# Patient Record
Sex: Female | Born: 1970 | Race: White | Hispanic: No | Marital: Married | Smoking: Former smoker
Health system: Southern US, Community
[De-identification: ages and names within clinical notes are randomized; demographics above are authoritative.]

## PROBLEM LIST (undated history)

## (undated) ENCOUNTER — Ambulatory Visit: Admission: EM | Source: Home / Self Care

## (undated) DIAGNOSIS — E2839 Other primary ovarian failure: Secondary | ICD-10-CM

## (undated) DIAGNOSIS — E288 Other ovarian dysfunction: Secondary | ICD-10-CM

## (undated) DIAGNOSIS — R87619 Unspecified abnormal cytological findings in specimens from cervix uteri: Secondary | ICD-10-CM

## (undated) DIAGNOSIS — N870 Mild cervical dysplasia: Secondary | ICD-10-CM

## (undated) DIAGNOSIS — I10 Essential (primary) hypertension: Secondary | ICD-10-CM

## (undated) DIAGNOSIS — IMO0002 Reserved for concepts with insufficient information to code with codable children: Secondary | ICD-10-CM

## (undated) DIAGNOSIS — A630 Anogenital (venereal) warts: Secondary | ICD-10-CM

## (undated) HISTORY — PX: FOOT SURGERY: SHX648

## (undated) HISTORY — DX: Other primary ovarian failure: E28.39

## (undated) HISTORY — DX: Anogenital (venereal) warts: A63.0

## (undated) HISTORY — DX: Mild cervical dysplasia: N87.0

## (undated) HISTORY — DX: Unspecified abnormal cytological findings in specimens from cervix uteri: R87.619

## (undated) HISTORY — DX: Other ovarian dysfunction: E28.8

## (undated) HISTORY — DX: Reserved for concepts with insufficient information to code with codable children: IMO0002

## (undated) HISTORY — PX: OVARIAN CYST REMOVAL: SHX89

---

## 2000-10-10 ENCOUNTER — Emergency Department (HOSPITAL_COMMUNITY): Admission: EM | Admit: 2000-10-10 | Discharge: 2000-10-10 | Payer: Self-pay | Admitting: *Deleted

## 2004-05-21 ENCOUNTER — Emergency Department (HOSPITAL_COMMUNITY): Admission: EM | Admit: 2004-05-21 | Discharge: 2004-05-21 | Payer: Self-pay | Admitting: Family Medicine

## 2004-07-12 ENCOUNTER — Other Ambulatory Visit: Admission: RE | Admit: 2004-07-12 | Discharge: 2004-07-12 | Payer: Self-pay | Admitting: Obstetrics and Gynecology

## 2005-04-11 ENCOUNTER — Other Ambulatory Visit: Admission: RE | Admit: 2005-04-11 | Discharge: 2005-04-11 | Payer: Self-pay | Admitting: Obstetrics and Gynecology

## 2006-01-05 ENCOUNTER — Emergency Department (HOSPITAL_COMMUNITY): Admission: EM | Admit: 2006-01-05 | Discharge: 2006-01-05 | Payer: Self-pay | Admitting: Family Medicine

## 2006-01-26 ENCOUNTER — Emergency Department (HOSPITAL_COMMUNITY): Admission: EM | Admit: 2006-01-26 | Discharge: 2006-01-26 | Payer: Self-pay | Admitting: Family Medicine

## 2009-11-03 HISTORY — PX: COLPOSCOPY: SHX161

## 2010-04-22 ENCOUNTER — Other Ambulatory Visit: Payer: Self-pay | Admitting: Family Medicine

## 2010-04-22 DIAGNOSIS — R7989 Other specified abnormal findings of blood chemistry: Secondary | ICD-10-CM

## 2010-04-23 ENCOUNTER — Ambulatory Visit
Admission: RE | Admit: 2010-04-23 | Discharge: 2010-04-23 | Disposition: A | Discharge status: Home / Self Care | Payer: PRIVATE HEALTH INSURANCE | Source: Ambulatory Visit | Attending: Family Medicine | Admitting: Family Medicine

## 2010-04-23 DIAGNOSIS — R7989 Other specified abnormal findings of blood chemistry: Secondary | ICD-10-CM

## 2012-02-13 ENCOUNTER — Ambulatory Visit: Payer: Self-pay | Admitting: Obstetrics and Gynecology

## 2012-03-15 ENCOUNTER — Encounter: Payer: Self-pay | Admitting: Obstetrics and Gynecology

## 2012-03-15 ENCOUNTER — Other Ambulatory Visit: Payer: Self-pay | Admitting: Obstetrics and Gynecology

## 2012-03-15 ENCOUNTER — Ambulatory Visit: Payer: BC Managed Care – PPO | Admitting: Obstetrics and Gynecology

## 2012-03-15 VITALS — BP 120/68 | Ht 67.0 in | Wt 123.0 lb

## 2012-03-15 DIAGNOSIS — Z124 Encounter for screening for malignant neoplasm of cervix: Secondary | ICD-10-CM

## 2012-03-15 DIAGNOSIS — Z1231 Encounter for screening mammogram for malignant neoplasm of breast: Secondary | ICD-10-CM

## 2012-03-15 DIAGNOSIS — IMO0002 Reserved for concepts with insufficient information to code with codable children: Secondary | ICD-10-CM | POA: Insufficient documentation

## 2012-03-15 DIAGNOSIS — Z01419 Encounter for gynecological examination (general) (routine) without abnormal findings: Secondary | ICD-10-CM

## 2012-03-15 NOTE — Progress Notes (Signed)
ANNUAL GYNECOLOGIC EXAMINATION    Casey Keller is a 42 y.o. female, G0P0, who presents for an annual exam. The patient has a history of premature ovarian failure.  She has not had a cycle since 2007.  She was diagnosed with CIN-1 in 2011.  A bone density scan showed osteopenia in 2010.  Prior Hysterectomy: No    History   Social History  . Marital Status: Married    Spouse Name: N/A    Number of Children: N/A  . Years of Education: N/A   Social History Main Topics  . Smoking status: Current Every Day Smoker  . Smokeless tobacco: Never Used  . Alcohol Use: Yes     Comment: occasional beers  . Drug Use: No  . Sexually Active: Yes    Birth Control/ Protection: None   Other Topics Concern  . None   Social History Narrative  . None    Menstrual cycle:   LMP: Patient's last menstrual period was 12/10/2005.             The following portions of the patient's history were reviewed and updated as appropriate: allergies, current medications, past family history, past medical history, past social history, past surgical history and problem list.  Review of Systems Pertinent items are noted in HPI. Breast:Negative for breast lump,nipple discharge or nipple retraction Gastrointestinal: Negative for abdominal pain, change in bowel habits or rectal bleeding Urinary:negative   Objective:    BP 120/68  Ht 5\' 7"  (1.702 m)  Wt 123 lb (55.792 kg)  BMI 19.26 kg/m2  LMP 12/10/2005    Weight:  Wt Readings from Last 1 Encounters:  03/15/12 123 lb (55.792 kg)          BMI: Body mass index is 19.26 kg/(m^2).  General Appearance: Alert, appropriate appearance for age. No acute distress HEENT: Grossly normal Neck / Thyroid: Supple, no masses, nodes or enlargement Lungs: clear to auscultation bilaterally Back: No CVA tenderness Breast Exam: No masses or nodes.No dimpling, nipple retraction or discharge. Cardiovascular: Regular rate and rhythm. S1, S2, no murmur Gastrointestinal:  Soft, non-tender, no masses or organomegaly  ++++++++++++++++++++++++++++++++++++++++++++++++++++++++  Pelvic Exam: External genitalia: normal general appearance Vaginal: normal without tenderness, induration or masses. Relaxation: No Cervix: normal appearance Adnexa: normal bimanual exam Uterus: normal size, shape, and consistency Rectovaginal: normal rectal, no masses  ++++++++++++++++++++++++++++++++++++++++++++++++++++++++  Lymphatic Exam: Non-palpable nodes in neck, clavicular, axillary, or inguinal regions Neurologic: Normal speech, no tremor  Psychiatric: Alert and oriented, appropriate affect.  Assessment:    Normal gyn exam   Overweight or obese: No   Pelvic relaxation: No  Osteopenia  Premature ovarian failure  CIN-1 in 2011   Plan:    mammogram pap smear return annually or prn Contraception:no method    Medications prescribed: none  STD screen request: No   The updated Pap smear screening guidelines were discussed with the patient. The patient requested that I obtain a Pap smear: Yes.  Kegel exercises discussed: No.  Bone density scan.  Proper diet and regular exercise were reviewed.  Annual mammograms recommended starting at age 84. Proper breast care was discussed.  Screening colonoscopy is recommended beginning at age 64.  Regular health maintenance was reviewed.  Sleep hygiene was discussed.  Adequate calcium and vitamin D intake was emphasized.  Leonard Schwartz M.D.    Regular Periods: not since 2007 Mammogram: no  Monthly Breast Ex.: yes Exercise: no  Tetanus < 10 years: yes Seatbelts: yes  NI. Bladder Functn.: yes  Abuse at home: no  Daily BM's: yes Stressful Work: no  Healthy Diet: yes Sigmoid-Colonoscopy: n/a  Calcium: yes Medical problems this year: none   LAST PAP:02/03/2011 Normal  Contraception: None  Mammogram:  n/a  PCP: Milus Height NP  PMH: None   FMH: none   Last Bone Scan: -1.9 08/28/2008

## 2012-03-19 LAB — PAP IG W/ RFLX HPV ASCU

## 2012-04-18 ENCOUNTER — Ambulatory Visit: Payer: PRIVATE HEALTH INSURANCE

## 2012-05-21 ENCOUNTER — Ambulatory Visit: Payer: PRIVATE HEALTH INSURANCE

## 2014-02-14 ENCOUNTER — Other Ambulatory Visit (HOSPITAL_COMMUNITY)
Admission: RE | Admit: 2014-02-14 | Discharge: 2014-02-14 | Disposition: A | Discharge status: Home / Self Care | Payer: 59 | Source: Ambulatory Visit | Attending: Family Medicine | Admitting: Family Medicine

## 2014-02-14 DIAGNOSIS — Z124 Encounter for screening for malignant neoplasm of cervix: Secondary | ICD-10-CM | POA: Insufficient documentation

## 2014-02-14 DIAGNOSIS — Z1151 Encounter for screening for human papillomavirus (HPV): Secondary | ICD-10-CM | POA: Insufficient documentation

## 2015-08-18 ENCOUNTER — Encounter (HOSPITAL_COMMUNITY): Payer: Self-pay | Admitting: Emergency Medicine

## 2015-08-18 ENCOUNTER — Emergency Department (HOSPITAL_COMMUNITY): Payer: 59

## 2015-08-18 ENCOUNTER — Emergency Department (HOSPITAL_COMMUNITY)
Admission: EM | Admit: 2015-08-18 | Discharge: 2015-08-18 | Disposition: A | Discharge status: Home / Self Care | Payer: 59 | Attending: Emergency Medicine | Admitting: Emergency Medicine

## 2015-08-18 DIAGNOSIS — R202 Paresthesia of skin: Secondary | ICD-10-CM

## 2015-08-18 DIAGNOSIS — F17200 Nicotine dependence, unspecified, uncomplicated: Secondary | ICD-10-CM | POA: Diagnosis not present

## 2015-08-18 DIAGNOSIS — Z5181 Encounter for therapeutic drug level monitoring: Secondary | ICD-10-CM | POA: Diagnosis not present

## 2015-08-18 DIAGNOSIS — R479 Unspecified speech disturbances: Secondary | ICD-10-CM

## 2015-08-18 DIAGNOSIS — I10 Essential (primary) hypertension: Secondary | ICD-10-CM | POA: Insufficient documentation

## 2015-08-18 DIAGNOSIS — R4789 Other speech disturbances: Secondary | ICD-10-CM | POA: Diagnosis not present

## 2015-08-18 LAB — COMPREHENSIVE METABOLIC PANEL
ALBUMIN: 4.4 g/dL (ref 3.5–5.0)
ALT: 40 U/L (ref 14–54)
ANION GAP: 10 (ref 5–15)
AST: 50 U/L — ABNORMAL HIGH (ref 15–41)
Alkaline Phosphatase: 55 U/L (ref 38–126)
BILIRUBIN TOTAL: 0.7 mg/dL (ref 0.3–1.2)
BUN: 6 mg/dL (ref 6–20)
CO2: 26 mmol/L (ref 22–32)
Calcium: 9.7 mg/dL (ref 8.9–10.3)
Chloride: 99 mmol/L — ABNORMAL LOW (ref 101–111)
Creatinine, Ser: 0.63 mg/dL (ref 0.44–1.00)
GFR calc non Af Amer: >60 mL/min (ref >60)
GLUCOSE: 114 mg/dL — AB (ref 65–99)
POTASSIUM: 3.8 mmol/L (ref 3.5–5.1)
Sodium: 135 mmol/L (ref 135–145)
TOTAL PROTEIN: 7 g/dL (ref 6.5–8.1)

## 2015-08-18 LAB — I-STAT CHEM 8, ED
BUN: 6 mg/dL (ref 6–20)
Calcium, Ion: 1.1 mmol/L — ABNORMAL LOW (ref 1.13–1.30)
Chloride: 96 mmol/L — ABNORMAL LOW (ref 101–111)
Creatinine, Ser: 0.6 mg/dL (ref 0.44–1.00)
Glucose, Bld: 114 mg/dL — ABNORMAL HIGH (ref 65–99)
HEMATOCRIT: 44 % (ref 36.0–46.0)
HEMOGLOBIN: 15 g/dL (ref 12.0–15.0)
Potassium: 3.8 mmol/L (ref 3.5–5.1)
SODIUM: 135 mmol/L (ref 135–145)
TCO2: 28 mmol/L (ref 0–100)

## 2015-08-18 LAB — DIFFERENTIAL
Basophils Absolute: 0 10*3/uL (ref 0.0–0.1)
Basophils Relative: 1 %
EOS ABS: 0 10*3/uL (ref 0.0–0.7)
EOS PCT: 1 %
LYMPHS ABS: 1 10*3/uL (ref 0.7–4.0)
Lymphocytes Relative: 20 %
Monocytes Absolute: 0.3 10*3/uL (ref 0.1–1.0)
Monocytes Relative: 7 %
NEUTROS PCT: 71 %
Neutro Abs: 3.4 10*3/uL (ref 1.7–7.7)

## 2015-08-18 LAB — CBC
HCT: 42.4 % (ref 36.0–46.0)
Hemoglobin: 14.3 g/dL (ref 12.0–15.0)
MCH: 32.2 pg (ref 26.0–34.0)
MCHC: 33.7 g/dL (ref 30.0–36.0)
MCV: 95.5 fL (ref 78.0–100.0)
PLATELETS: 177 10*3/uL (ref 150–400)
RBC: 4.44 MIL/uL (ref 3.87–5.11)
RDW: 12.5 % (ref 11.5–15.5)
WBC: 4.7 10*3/uL (ref 4.0–10.5)

## 2015-08-18 LAB — APTT: aPTT: 27 seconds (ref 24–36)

## 2015-08-18 LAB — I-STAT TROPONIN, ED: Troponin i, poc: 0 ng/mL (ref 0.00–0.08)

## 2015-08-18 LAB — ETHANOL: Alcohol, Ethyl (B): <5 mg/dL (ref <5)

## 2015-08-18 LAB — PROTIME-INR
INR: 0.95
PROTHROMBIN TIME: 12.7 s (ref 11.4–15.2)

## 2015-08-18 NOTE — Discharge Instructions (Signed)
You must follow-up with your family physician. You need close monitoring for your blood pressure to determine if medications are indicated. You may also may need referral to a neurologist. Have your family doctor follow-up on your MRI results.

## 2015-08-18 NOTE — ED Provider Notes (Signed)
[facility]-EMERGENCY DEPT Provider Note   CSN: [id number] Arrival date & time: 08/18/15  1550  First Provider Contact:  None       History   Chief Complaint Chief Complaint  Patient presents with  . Code Stroke    HPI Casey Keller is a 45 y.o. female.  HPI Patient developed right-sided facial tingling approximately 2 PM at work. She reports this extended to include her right arm. At that time, she was making a phone call and had difficulty speaking. Speech is improved. She also reports that this time that the tingling sensation has resolved in her arm. She estimates it lasted 2 hours. She does note she has intermittently had some imbalance. This occur sometimes when she gets up from standing and she reports she takes an extra step and asked to right herself. This is an occasional occurrence that she has noted in the past year. She has never before had problems with paresthesia as she experienced them today. As not get headaches. Patient has not recently been ill. She does note earlier today she had some temporary blurred vision which she had ignored. Notably, the patient is hypertensive with elevated diastolic pressures over 100. She reports she has not been going and getting her blood pressure checked regularly. She estimates her last blood pressure check was approximately year ago. She does report prior to this it has sporadically been elevated and she mentions diastolic pressures occasionally up to 120. Despite this she is not on antihypertensives and has not had consistent blood pressure monitoring. She does report both her mother and her sister are hypertensive. Past Medical History:  Diagnosis Date  . Abnormal pap   . CIN I (cervical intraepithelial neoplasia I)   . HPV (human papilloma virus) anogenital infection   . Premature ovarian failure     Patient Active Problem List   Diagnosis Date Noted  . Abnormal pap     Past Surgical History:  Procedure Laterality Date  .  COLPOSCOPY  11/03/2009  . FOOT SURGERY Right   . OVARIAN CYST REMOVAL Bilateral     OB History    Gravida Para Term Preterm AB Living   0             SAB TAB Ectopic Multiple Live Births                   Home Medications    Prior to Admission medications   Not on File    Family History Family History  Problem Relation Age of Onset  . Diabetes Mother   . Dementia Mother   . Heart attack Father   . Diabetes Maternal Grandmother     Social History Social History  Substance Use Topics  . Smoking status: Current Every Day Smoker  . Smokeless tobacco: Never Used  . Alcohol use Yes     Comment: occasional beers     Allergies   Amoxicillin and Valtrex [valacyclovir hcl]   Review of Systems Review of Systems 10 Systems reviewed and are negative for acute change except as noted in the HPI.   Physical Exam Updated Vital Signs BP 149/99   Pulse 75   Temp 98.3 F (36.8 C)   Resp 21   LMP 12/10/2005   SpO2 97%   Physical Exam  Constitutional: She is oriented to person, place, and time. She appears well-developed and well-nourished. No distress.  HENT:  Head: Normocephalic and atraumatic.  Right Ear: External ear normal.  Left Ear:  External ear normal.  Nose: Nose normal.  Mouth/Throat: Oropharynx is clear and moist.  Eyes: EOM are normal. Pupils are equal, round, and reactive to light.  Neck: Neck supple. No thyromegaly present.  Cardiovascular: Normal rate, regular rhythm, normal heart sounds and intact distal pulses.   Pulmonary/Chest: Effort normal and breath sounds normal.  Abdominal: Soft. She exhibits no distension. There is no tenderness.  Musculoskeletal: Normal range of motion. She exhibits no edema or tenderness.  Lymphadenopathy:    She has no cervical adenopathy.  Neurological: She is alert and oriented to person, place, and time. No cranial nerve deficit. She exhibits normal muscle tone. Coordination normal.  Normal finger-nose examination  bilaterally. No pronator drift.  Skin: Skin is warm and dry. Capillary refill takes less than 2 seconds.  Psychiatric: She has a normal mood and affect.     ED Treatments / Results  Labs (all labs ordered are listed, but only abnormal results are displayed) Labs Reviewed  COMPREHENSIVE METABOLIC PANEL - Abnormal; Notable for the following:       Result Value   Chloride 99 (*)    Glucose, Bld 114 (*)    AST 50 (*)    All other components within normal limits  I-STAT CHEM 8, ED - Abnormal; Notable for the following:    Chloride 96 (*)    Glucose, Bld 114 (*)    Calcium, Ion 1.10 (*)    All other components within normal limits  ETHANOL  PROTIME-INR  APTT  CBC  DIFFERENTIAL  URINE RAPID DRUG SCREEN, HOSP PERFORMED  URINALYSIS, ROUTINE W REFLEX MICROSCOPIC (NOT AT [facility])  Rosezena Sensor, ED  I-STAT CHEM 8, ED  I-STAT TROPOININ, ED    EKG  EKG Interpretation  Date/Time:  Tuesday August 18 2015 16:29:11 EDT Ventricular Rate:  78 PR Interval:    QRS Duration: 96 QT Interval:  430 QTC Calculation: 490 R Axis:   62 Text Interpretation:  Sinus rhythm Borderline prolonged QT interval agree. otherwise normal. no old comparison Confirmed by Donnald Garre, MD, Lebron Conners [phone]) on 08/18/2015 4:36:17 PM       Radiology Mr Brain Wo Contrast (neuro Protocol)  Result Date: 08/18/2015 CLINICAL DATA:  45 year old female with tingling temporal region extending to right-sided face and right arm. Episode of abnormal speech. Symptoms have decreased. Subsequent encounter. EXAM: MRI HEAD WITHOUT CONTRAST TECHNIQUE: Multiplanar, multiecho pulse sequences of the brain and surrounding structures were obtained without intravenous contrast. COMPARISON:  09/05/2015 head CT.  No comparison brain MR. FINDINGS: No acute infarct or intracranial hemorrhage. No intracranial mass lesion noted on this unenhanced exam. Global atrophy without hydrocephalus. Patent cavum septum pellucidum et vergae incidentally noted.  Major intracranial vascular structures are patent. Ectatic vertebral arteries and basilar artery. No acute orbital abnormality. Minimal mucosal thickening ethmoid sinus air cells and maxillary sinuses Cervical medullary junction unremarkable. IMPRESSION: No acute infarct or intracranial hemorrhage. No intracranial mass lesion noted on this unenhanced exam. Global atrophy. Electronically Signed   By: Lacy Duverney M.D.   On: 08/18/2015 18:03   Ct Head Code Stroke W/o Cm  Result Date: 08/18/2015 CLINICAL DATA:  Code stroke. Right-sided numbness and tingling. Slurred speech. EXAM: CT HEAD WITHOUT CONTRAST TECHNIQUE: Contiguous axial images were obtained from the base of the skull through the vertex without intravenous contrast. COMPARISON:  None. FINDINGS: The brain has normal appearance without evidence of old or acute infarction, mass lesion, hemorrhage, hydrocephalus or extra-axial collection. No hyperdense vessels. The calvarium is unremarkable. Sinuses are clear. ASPECTS (  [place] Stroke Program Early CT Score, http://www.aspectsinstroke.com) - Ganglionic level infarction (caudate, lentiform nuclei, internal capsule, insula, M1-M3 cortex): 7 - Supraganglionic infarction (M4-M6 cortex): 3 Total score (0-10): 10 IMPRESSION: 1. Normal head CT 2. ASPECTS score 10 These results were called by telephone at the time of interpretation on 08/18/2015 at 4:06 pm to Dr. Roxy Manns, who verbally acknowledged these results. Electronically Signed   By: Paulina Fusi M.D.   On: 08/18/2015 16:12    Procedures Procedures (including critical care time)  Medications Ordered in ED Medications - No data to display   Initial Impression / Assessment and Plan / ED Course  I have reviewed the triage vital signs and the nursing notes.  Pertinent labs & imaging results that were available during my care of the patient were reviewed by me and considered in my medical decision making (see chart for details).  Clinical Course      Final Clinical Impressions(s) / ED Diagnoses   Final diagnoses:  Paresthesia of right upper extremity  Speech disturbance   A she had acute symptoms of paresthesia of the right upper extremity as well as the face. She also had a period of speech impairment. These have completely resolved. The neurologic examination is now normal. Patient does appear to have likely diastolic hypertension that may be chronic based on her history. Her pressures have spontaneously improved to a diastolic under 100 and she is not symptomatic. She is counseled necessity of close follow-up with her doctor to determine if medication therapy is indicated. He is also made aware of the nonspecific finding of atrophy on her MRI. This is not likely to have any relationship to her episode today however she is made aware of the finding. New Prescriptions New Prescriptions   No medications on file     Arby Barrette, MD 08/18/15 1839

## 2015-08-18 NOTE — ED Triage Notes (Signed)
Pt c/o onset R facial tingling and numbness at 1400 today, then shortly after she was making a phone call and was unable to speak. She continues to have tingling and numbness since, but reports speech has improved. She is alert and breathing easily. Code stroke called

## 2015-08-18 NOTE — Code Documentation (Signed)
45yo female arriving to [facility] via private vehicle at 1550.  Patient reports that she was at work when she noticed tingling from her right face down to her fingertips on her right hand at 1400.  She was leaving a voicemail at 1510 when she had loss of speech.  She reports walking to her boss' office and her speech had returned.  Patient presented to the ED where a code stroke was called.  Patient to CT.  Stroke team to the bedside.  NIHSS 0, see documentation for details and code stroke times.  Patient now reporting tingling along the top of her head.  No acute stroke treatment at this time.  Code stroke canceled.  Bedside handoff with ED RN Leeroy Bock.

## 2015-08-18 NOTE — ED Notes (Signed)
Patient transported to MRI 

## 2015-08-18 NOTE — ED Notes (Signed)
Pt reports she began having tingling in her temples and down her right arm and hand, pt also reports around 1500 she felt like she was having trouble finding her words. Pt is a/ox4, speech clear, resp e/u.

## 2015-08-18 NOTE — ED Notes (Signed)
Stroke MD advised code stroke can be cancelled at this time.

## 2015-08-18 NOTE — Consult Note (Signed)
Neurology Consult Note  Reason for Consultation: CODE STROKE  Requesting provider: Pfeiffer  CC: tingling, trouble speaking  HPI: This is a 45 year old right-handed woman who presents to the emergency department for evaluation of some tingling. History is obtained directly from the patient was an excellent historian.   She reports that she has been in her usual state of health until earlier this afternoon. She was at work at about 1400 when she noticed some tingling involving both of her temples. Over the next hour, this tingling seemed to move across the top of her head and then into the right side of her face before settling in her right arm. About an hour later, she was on the telephone and was leaving a voicemail and states that in the middle of the message she suddenly lost the ability to speak and could not get any sounds out. She reports that this speech difficulty was very brief and by the time she walked on the hallway to her boss's office it had resolved. The tingling has persisted. However, this is continued to be migratory and presently she is not having any tingling in her right arm or face but now says the tingling of his only involving the top of her head. In addition, right now she only notices the tingling when she sits up and feels like it goes away when she lays down. She never had any changes with her vision. She denies any associated weakness. She did feel as if she was a little bit off balance at one point. There was no associated headache.  Last known well: 1400 NIH stroke scale: 0 TPA given: No, symptoms not consistent with stroke.  PMH:  Past Medical History:  Diagnosis Date  . Abnormal pap   . CIN I (cervical intraepithelial neoplasia I)   . HPV (human papilloma virus) anogenital infection   . Premature ovarian failure     PSH:  Past Surgical History:  Procedure Laterality Date  . COLPOSCOPY  11/03/2009  . FOOT SURGERY Right   . OVARIAN CYST REMOVAL Bilateral      Family history: She reports that her mother had diabetes and several strokes. Her father died of a heart attack in his early 21s. Her sister has an unspecified cardiac problem and they describe what sounds like an arrhythmia.  Social history:  She is married and lives with her husband. She has history of cigarette smoking but currently tapes and is trying to reduce her nicotine intake. She reports that she drinks 4-5 beers and 2 glasses of wine every night. She denies any illicit drug use.   Current inpatient meds:  No current facility-administered medications for this encounter.    Current Outpatient Prescriptions  Medication Sig Dispense Refill  . Calcium Carbonate-Vitamin D (CALCIUM-VITAMIN D) 500-200 MG-UNIT per tablet Take 1 tablet by mouth 2 (two) times daily with a meal.      Allergies: Allergies  Allergen Reactions  . Amoxicillin Hives  . Valtrex [Valacyclovir Hcl] Swelling    ROS: As per HPI. A full 14-point review of systems was performed and is otherwise notable for chronic diarrhea which occurs 1-2 times per week. Review of systems is otherwise unremarkable.  PE:  BP (!) 174/114   Pulse 83   Temp 98.3 F (36.8 C) (Oral)   Resp 20   LMP 12/10/2005   SpO2 95%   General: WDWN lying on ED gurney, mildly anxious appearing. She is constantly fidgeting and complains of a dry mouth. AAO x4.  Speech clear, no dysarthria. No aphasia. Follows commands briskly. Affect is bright with congruent mood. Comportment is normal.  HEENT: Normocephalic. Neck supple without LAD. MMM, OP clear. Dentition good. Sclerae anicteric. No conjunctival injection.  CV: Regular, no murmur. Carotid pulses full and symmetric, no bruits. Distal pulses 2+ and symmetric.  Lungs: CTAB.  Abdomen: Soft, non-distended, non-tender. Bowel sounds present x4.  Extremities: No C/C/E. Neuro:  CN: Pupils are equal and round. They are symmetrically reactive from 3-->2 mm. EOMI without nystagmus. No reported  diplopia. Facial sensation is intact to light touch. Face is symmetric at rest with normal strength and mobility. Hearing is intact to conversational voice. Palate elevates symmetrically and uvula is midline. Voice is normal in tone, pitch and quality. Bilateral SCM and trapezii are 5/5. Tongue is midline with normal bulk and mobility.  Motor: Normal bulk, tone, and strength. She has a fine high frequency low amplitude kinetic and postural tremor involving both arms. She is constantly fidgeting with her hands throughout the examination. or other abnormal movements. No drift.  Sensation: Intact to light touch and pinprick. Vibration is mildly reduced in both feet. Joint position is moderately impaired in both great toes.  DTRs: Brisk 2+, symmetric. Toes downgoing bilaterally. No pathologic reflexes.  Coordination: Finger-to-nose and heel-to-shin are without dysmetria. Finger taps are normal in amplitude and speed, no decrement.    Labs:  Lab Results  Component Value Date   WBC 4.7 08/18/2015   HGB 15.0 08/18/2015   HCT 44.0 08/18/2015   PLT 177 08/18/2015   GLUCOSE 114 (H) 08/18/2015   NA 135 08/18/2015   K 3.8 08/18/2015   CL 96 (L) 08/18/2015   CREATININE 0.60 08/18/2015   BUN 6 08/18/2015   INR 0.95 08/18/2015    Imaging:  I have personally and independently reviewed CT scan of the head without contrast from 08/18/15. This shows no evidence of acute pathology. She has greater than expected atrophy for her age. There is prominent atrophy of the cerebellar vermis, consistent with excessive alcohol use. She has a cavum septum pellucidum.   Assessment and Plan:  1. Tingling: This is nonspecific and does not localize. This is not consistent with acute stroke or other central process. This may be more related to anxiety as she is clearly anxious during this encounter. The patient reports that she has not had any significant stress recently. However, her husband interjected that there has been  increased stress because his parents are visiting from out of town. In addition, he states that there has been "drama" with his brother and sister-in-law. In addition, the patient returned to work today after having been off for several days. Stress therefore may be playing some role in her symptoms today. At this point, I do not find a primary neurologic diagnosis to explain her tingling. Given that it has largely resolved and is nonanatomic in its distribution, no further testing is warranted. Reassurance was provided.  2. 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 In this case, I suspect this may be related to her excessive alcohol intake. Further evaluation may be pursued in the outpatient. This should include testing of vitamin B12 levels, thyroid function, folate, and hemoglobin A1c with potential consideration of an oral glucose tolerance test if hemoglobin A1c is unremarkable. She was counseled on alcohol cessation and encouraged to reduce her intake in order to minimize further damage to her peripheral nerves.  3. Alcohol abuse:  She drinks excessive amount of alcohol on a daily basis. Her CT scan shows evidence of atrophy of the cerebellar vermis as well as diffuse cerebral atrophy, findings commonly seen with alcohol abuse. In addition, she has evidence of peripheral neuropathy as noted above. Alcohol is a common cause of peripheral neuropathy and is the likely culprit in this case. She was counseled on alcohol cessation and verbalizes understanding.  This is discussed with the patient and her husband. They're in agreement with the plan as stated. They were given the opportunity to ask any questions and these were addressed to their satisfaction.  The case was also discussed with Dr. Donnald Garre in the ED. I have no further recommendations at this time and will sign off.

## 2015-08-18 NOTE — ED Notes (Signed)
Stroke rn and pharmacy arrived to CT

## 2017-01-20 ENCOUNTER — Other Ambulatory Visit: Payer: Self-pay

## 2017-01-20 ENCOUNTER — Emergency Department (HOSPITAL_COMMUNITY): Payer: BLUE CROSS/BLUE SHIELD

## 2017-01-20 ENCOUNTER — Encounter (HOSPITAL_COMMUNITY): Payer: Self-pay

## 2017-01-20 ENCOUNTER — Emergency Department (HOSPITAL_COMMUNITY)
Admission: EM | Admit: 2017-01-20 | Discharge: 2017-01-20 | Disposition: A | Discharge status: Home / Self Care | Payer: BLUE CROSS/BLUE SHIELD | Attending: Emergency Medicine | Admitting: Emergency Medicine

## 2017-01-20 DIAGNOSIS — Z87891 Personal history of nicotine dependence: Secondary | ICD-10-CM | POA: Insufficient documentation

## 2017-01-20 DIAGNOSIS — R0781 Pleurodynia: Secondary | ICD-10-CM

## 2017-01-20 DIAGNOSIS — Z7982 Long term (current) use of aspirin: Secondary | ICD-10-CM | POA: Diagnosis not present

## 2017-01-20 DIAGNOSIS — R0789 Other chest pain: Secondary | ICD-10-CM | POA: Diagnosis present

## 2017-01-20 LAB — BASIC METABOLIC PANEL
Anion gap: 10 (ref 5–15)
BUN: 9 mg/dL (ref 6–20)
CHLORIDE: 98 mmol/L — AB (ref 101–111)
CO2: 28 mmol/L (ref 22–32)
CREATININE: 0.61 mg/dL (ref 0.44–1.00)
Calcium: 9.8 mg/dL (ref 8.9–10.3)
GFR calc non Af Amer: >60 mL/min (ref >60)
Glucose, Bld: 95 mg/dL (ref 65–99)
POTASSIUM: 3.6 mmol/L (ref 3.5–5.1)
Sodium: 136 mmol/L (ref 135–145)

## 2017-01-20 LAB — CBC
HEMATOCRIT: 42.4 % (ref 36.0–46.0)
HEMOGLOBIN: 14.3 g/dL (ref 12.0–15.0)
MCH: 32.7 pg (ref 26.0–34.0)
MCHC: 33.7 g/dL (ref 30.0–36.0)
MCV: 97 fL (ref 78.0–100.0)
PLATELETS: 156 10*3/uL (ref 150–400)
RBC: 4.37 MIL/uL (ref 3.87–5.11)
RDW: 11.9 % (ref 11.5–15.5)
WBC: 5.9 10*3/uL (ref 4.0–10.5)

## 2017-01-20 LAB — I-STAT BETA HCG BLOOD, ED (MC, WL, AP ONLY): I-stat hCG, quantitative: <5 m[IU]/mL (ref <5)

## 2017-01-20 LAB — I-STAT TROPONIN, ED: Troponin i, poc: 0 ng/mL (ref 0.00–0.08)

## 2017-01-20 NOTE — ED Provider Notes (Signed)
[facility]-EMERGENCY DEPT Provider Note   CSN: [id number] Arrival date & time: 01/20/17  1805     History   Chief Complaint Chief Complaint  Patient presents with  . Chest Pain    right     HPI Casey Keller is a 47 y.o. female.  The history is provided by the patient. No language interpreter was used.  Chest Pain     Casey Keller is a 47 y.o. female who presents to the Emergency Department complaining of chest pain.  She reports 2 days of right-sided chest pain.  Pain is only present with deep breaths.  She woke up with the pain on Wednesday morning.  No known injuries.  She denies any fevers, shortness of breath, cough, nausea, vomiting, abdominal pain, leg swelling or pain.  She takes no hormone medications.  She does have a history of hypertension and takes atenolol for this daily.  She does have a history of alcohol abuse and stopped drinking 3 days ago.  She saw her PCP today was told that her blood pressure was high and was given clonidine and referred to the emergency department for possible blood clot. Past Medical History:  Diagnosis Date  . Abnormal pap   . CIN I (cervical intraepithelial neoplasia I)   . HPV (human papilloma virus) anogenital infection   . Premature ovarian failure     Patient Active Problem List   Diagnosis Date Noted  . Abnormal pap     Past Surgical History:  Procedure Laterality Date  . COLPOSCOPY  11/03/2009  . FOOT SURGERY Right   . OVARIAN CYST REMOVAL Bilateral     OB History    Gravida Para Term Preterm AB Living   0             SAB TAB Ectopic Multiple Live Births                   Home Medications    Prior to Admission medications   Medication Sig Start Date End Date Taking? Authorizing Provider  aspirin EC 81 MG tablet Take 81 mg by mouth daily.   Yes [provider]  atenolol (TENORMIN) 25 MG tablet Take 25 mg by mouth daily. 12/22/16  Yes [provider]    Family  History Family History  Problem Relation Age of Onset  . Diabetes Mother   . Dementia Mother   . Heart attack Father   . Diabetes Maternal Grandmother     Social History Social History   Tobacco Use  . Smoking status: Former Games developermoker  . Smokeless tobacco: Never Used  Substance Use Topics  . Alcohol use: Yes    Comment: occasional beers  . Drug use: No     Allergies   Amoxicillin and Valtrex [valacyclovir hcl]   Review of Systems Review of Systems  Cardiovascular: Positive for chest pain.  All other systems reviewed and are negative.    Physical Exam Updated Vital Signs BP (!) 143/99 (BP Location: Left Arm)   Pulse 70   Temp 97.9 F (36.6 C) (Oral)   Resp 18   Ht 5\' 7"  (1.702 m)   Wt 59.9 kg (132 lb)   LMP 12/10/2005   SpO2 99%   BMI 20.67 kg/m   Physical Exam  Constitutional: She is oriented to person, place, and time. She appears well-developed and well-nourished.  HENT:  Head: Normocephalic and atraumatic.  Cardiovascular: Normal rate and regular rhythm.  No murmur heard.  Pulmonary/Chest: Effort normal and breath sounds normal. No respiratory distress. She exhibits no tenderness.  Abdominal: Soft. There is no tenderness. There is no rebound and no guarding.  Musculoskeletal: She exhibits no edema or tenderness.  Neurological: She is alert and oriented to person, place, and time.  Skin: Skin is warm and dry.  Psychiatric: She has a normal mood and affect. Her behavior is normal.  Nursing note and vitals reviewed.    ED Treatments / Results  Labs (all labs ordered are listed, but only abnormal results are displayed) Labs Reviewed  BASIC METABOLIC PANEL - Abnormal; Notable for the following components:      Result Value   Chloride 98 (*)    All other components within normal limits  CBC  I-STAT TROPONIN, ED  I-STAT BETA HCG BLOOD, ED ([facility], [facility], [facility] ONLY)    EKG  EKG Interpretation  Date/Time:  Friday January 20 2017 18:35:04 EST Ventricular  Rate:  81 PR Interval:    QRS Duration: 87 QT Interval:  395 QTC Calculation: 459 R Axis:   95 Text Interpretation:  Sinus rhythm Probable left atrial enlargement Consider right ventricular hypertrophy Baseline wander in lead(s) I III aVL Confirmed by Tilden Fossa [phone]) on 01/20/2017 11:02:22 PM       Radiology Dg Chest 2 View  Result Date: 01/20/2017 CLINICAL DATA:  Right upper chest pain for 3 days. EXAM: CHEST  2 VIEW COMPARISON:  For 05/07/2010 FINDINGS: The lungs are clear without focal pneumonia, edema, pneumothorax or pleural effusion. The cardiopericardial silhouette is within normal limits for size. The visualized bony structures of the thorax are intact. IMPRESSION: No active cardiopulmonary disease. Electronically Signed   By: Kennith Center M.D.   On: 01/20/2017 19:42    Procedures Procedures (including critical care time)  Medications Ordered in ED Medications - No data to display   Initial Impression / Assessment and Plan / ED Course  I have reviewed the triage vital signs and the nursing notes.  Pertinent labs & imaging results that were available during my care of the patient were reviewed by me and considered in my medical decision making (see chart for details).     Patient here for evaluation of 2 days of pleuritic chest pain.  She is well-appearing on examination with no acute respiratory distress.  There is no evidence of acute infectious process.  No evidence of pneumonia.  Presentation is not consistent with ACS, dissection.  Patient was referred for rule out blood clot.  Discussed with patient that she is low risk (Perc negative).  Discussed risks and benefits of obtaining d-dimer and/or CT scan to rule out blood clot and she declined.  Discussed with patient home care for pleuritic chest pain.  Counseled her on close outpatient follow-up as well as return precautions.  Final Clinical Impressions(s) / ED Diagnoses   Final diagnoses:  Pleuritic chest pain      ED Discharge Orders    None       Tilden Fossa, MD 01/20/17 2340

## 2017-01-20 NOTE — Discharge Instructions (Signed)
You can take ibuprofen, available over-the-counter according to label directions as needed for chest wall pain.

## 2017-01-20 NOTE — ED Triage Notes (Addendum)
Patient c/o right pain under the right breast area/rib area x 2 days. Pain increases with a dep breath.  Patient saw her PCP today and was told to come to the ED for possible blood clot. Patient also had HTN and tachycardia. HR- 90 /NSR in triage.

## 2017-09-20 ENCOUNTER — Other Ambulatory Visit (HOSPITAL_COMMUNITY)
Admission: RE | Admit: 2017-09-20 | Discharge: 2017-09-20 | Disposition: A | Discharge status: Home / Self Care | Payer: BLUE CROSS/BLUE SHIELD | Source: Ambulatory Visit | Attending: Family Medicine | Admitting: Family Medicine

## 2017-09-20 ENCOUNTER — Other Ambulatory Visit: Payer: Self-pay | Admitting: Family Medicine

## 2017-09-20 DIAGNOSIS — Z01411 Encounter for gynecological examination (general) (routine) with abnormal findings: Secondary | ICD-10-CM | POA: Insufficient documentation

## 2017-09-22 LAB — CYTOLOGY - PAP: Diagnosis: NEGATIVE

## 2017-09-27 ENCOUNTER — Other Ambulatory Visit: Payer: Self-pay | Admitting: Family Medicine

## 2017-09-27 DIAGNOSIS — E28319 Asymptomatic premature menopause: Secondary | ICD-10-CM

## 2017-10-06 ENCOUNTER — Ambulatory Visit
Admission: RE | Admit: 2017-10-06 | Discharge: 2017-10-06 | Disposition: A | Discharge status: Home / Self Care | Payer: BLUE CROSS/BLUE SHIELD | Source: Ambulatory Visit | Attending: Family Medicine | Admitting: Family Medicine

## 2017-10-06 DIAGNOSIS — E28319 Asymptomatic premature menopause: Secondary | ICD-10-CM

## 2020-01-30 IMAGING — US US PELVIS COMPLETE TRANSABD/TRANSVAG
1 series · 14 of 25 positions shown · non-contrast
Comparison: None

CLINICAL DATA: Premature menopause

EXAM:
TRANSABDOMINAL AND TRANSVAGINAL ULTRASOUND OF PELVIS
TECHNIQUE: Both transabdominal and transvaginal ultrasound examinations of the
pelvis were performed. Transabdominal technique was performed for
global imaging of the pelvis including uterus, ovaries, adnexal
regions, and pelvic cul-de-sac. It was necessary to proceed with
endovaginal exam following the transabdominal exam to visualize the
left ovary and right adnexa.

[Series 1: us pelvis complete transabd/transvag · 0.23mm/px · 14 of 36 slices shown]
[im 1/36]
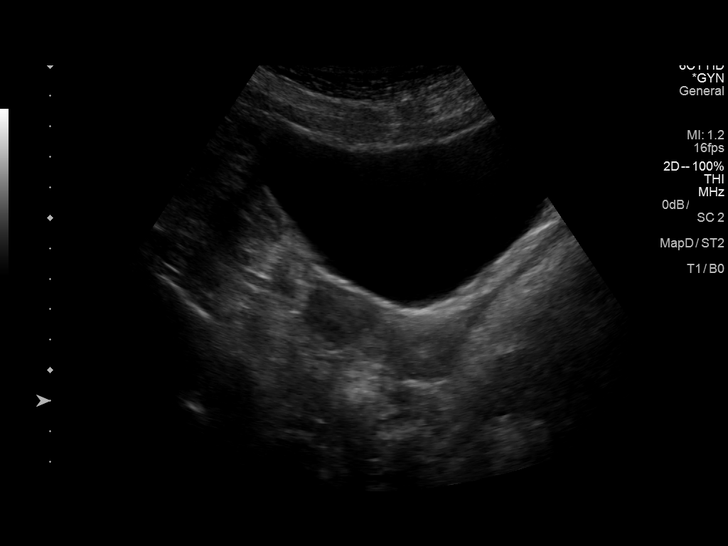
[im 3/36]
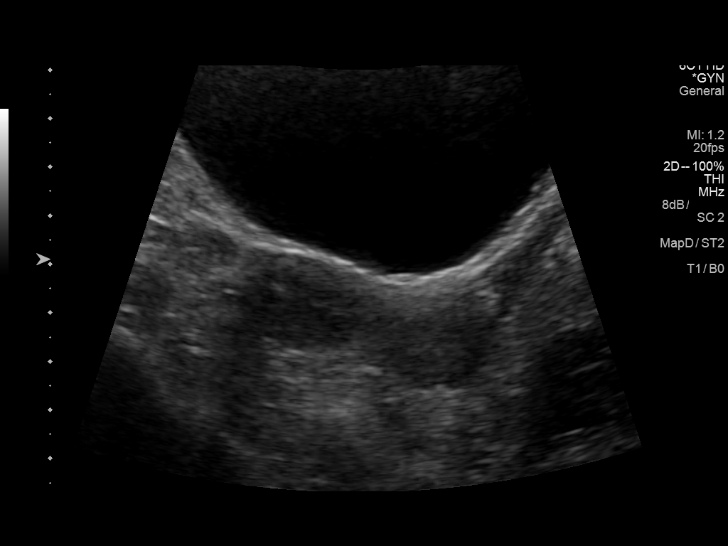
[im 6/36]
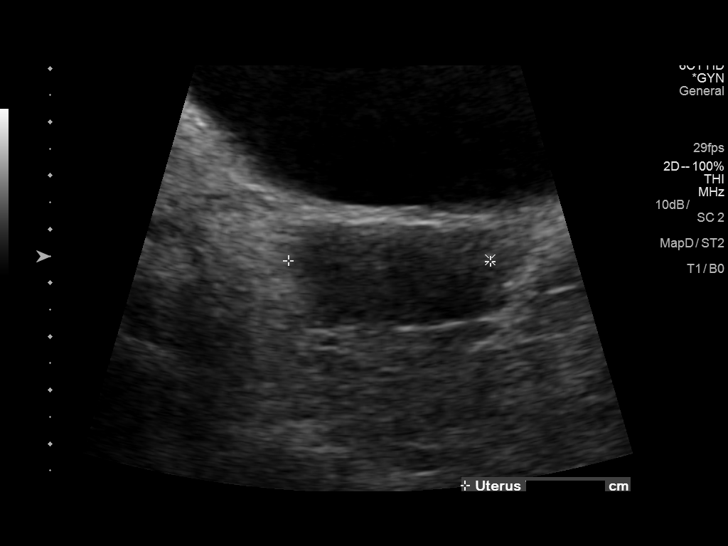
[im 9/36]
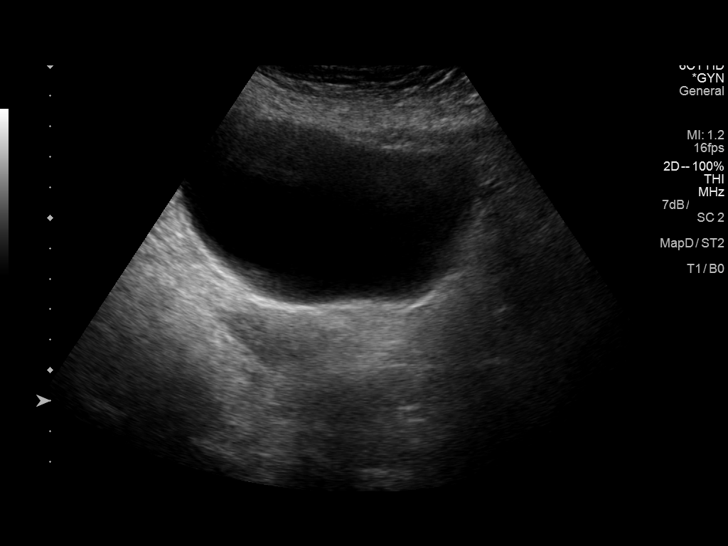
[im 12/36]
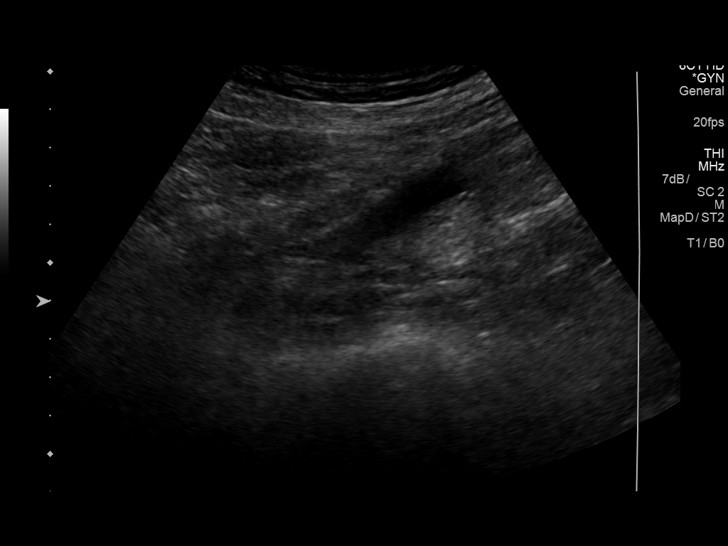
[im 14/36]
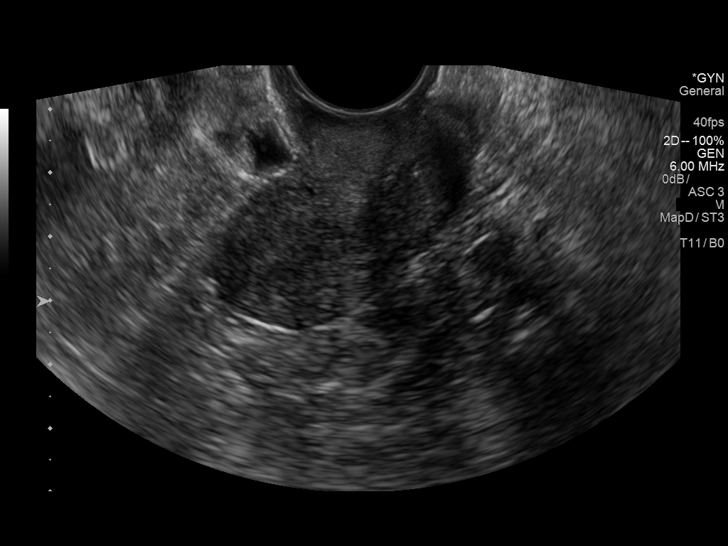
[im 17/36]
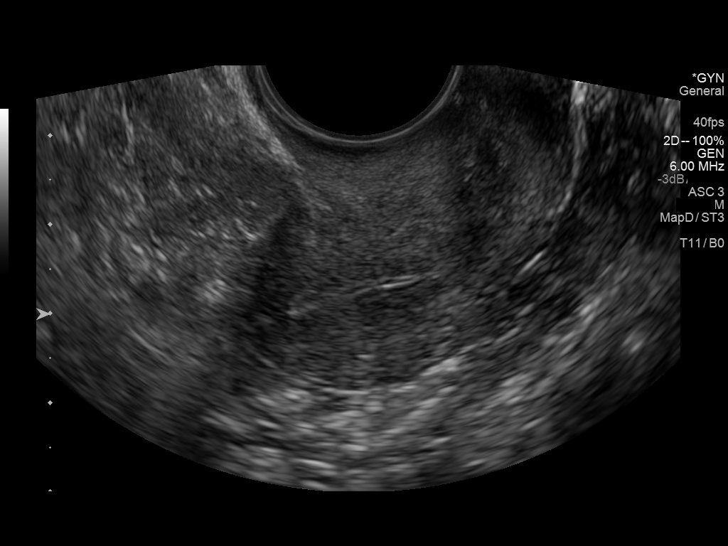
[im 19/36]
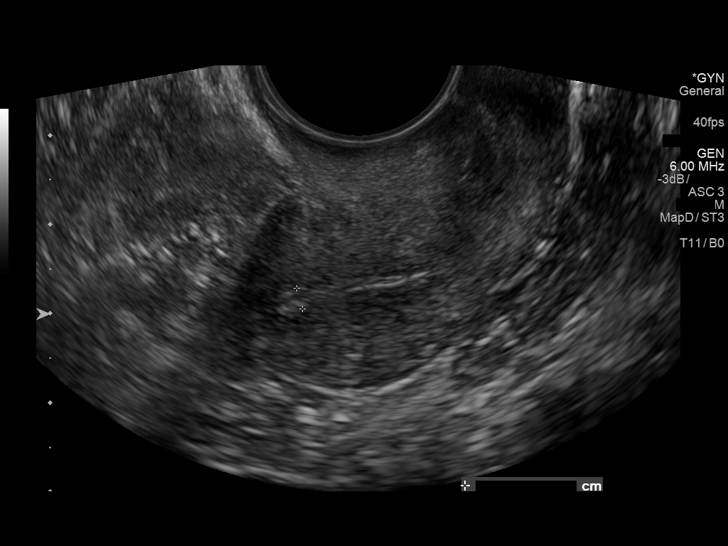
[im 22/36]
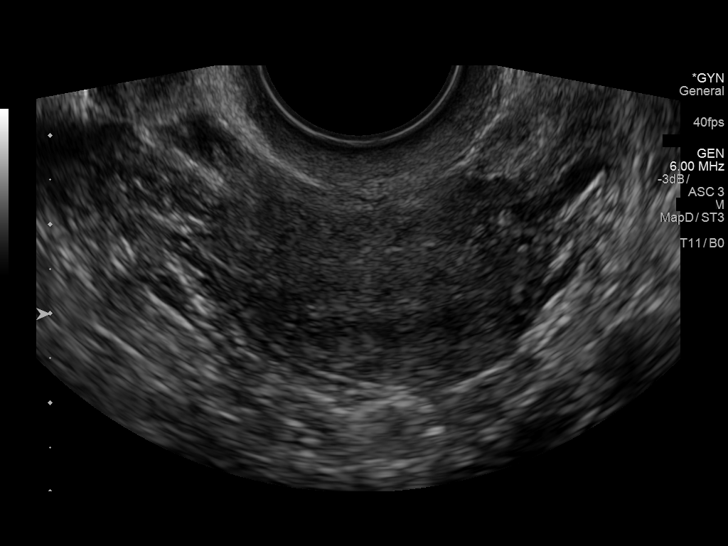
[im 24/36]
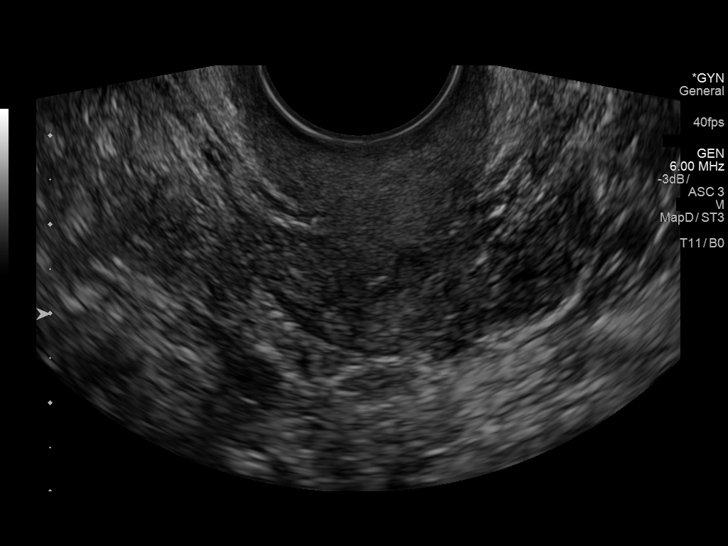
[im 27/36]
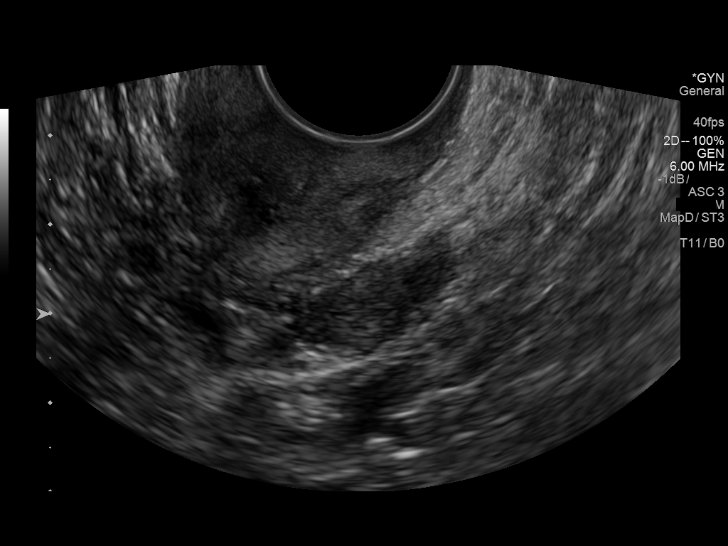
[im 30/36]
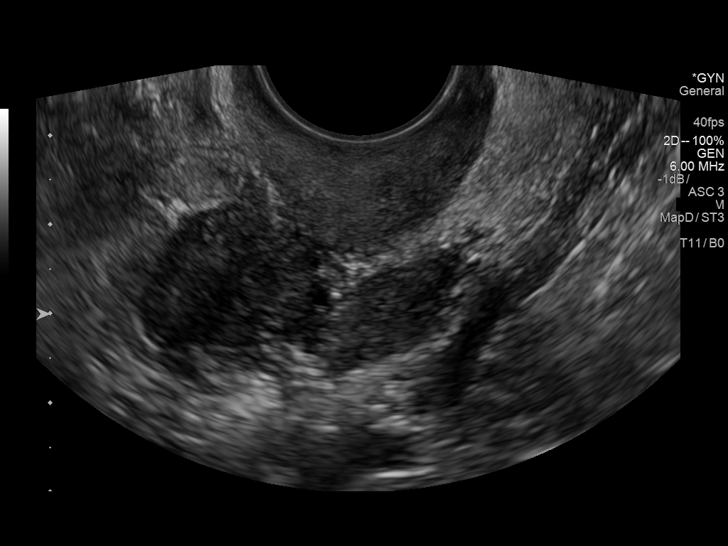
[im 33/36]
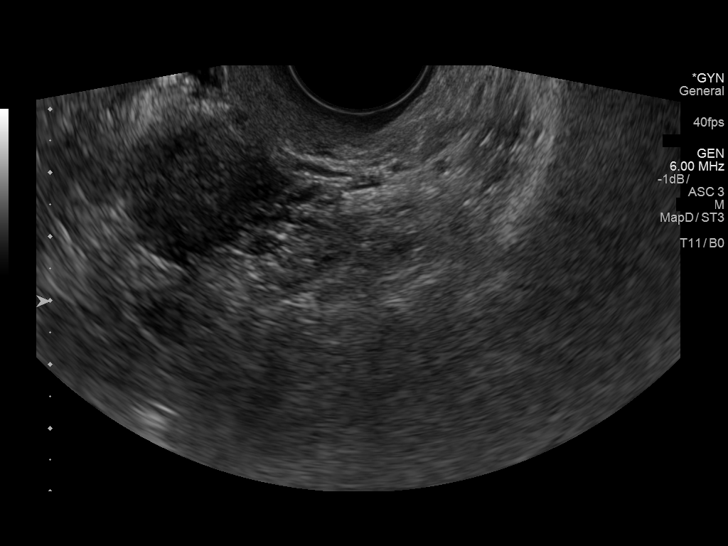
[im 36/36]
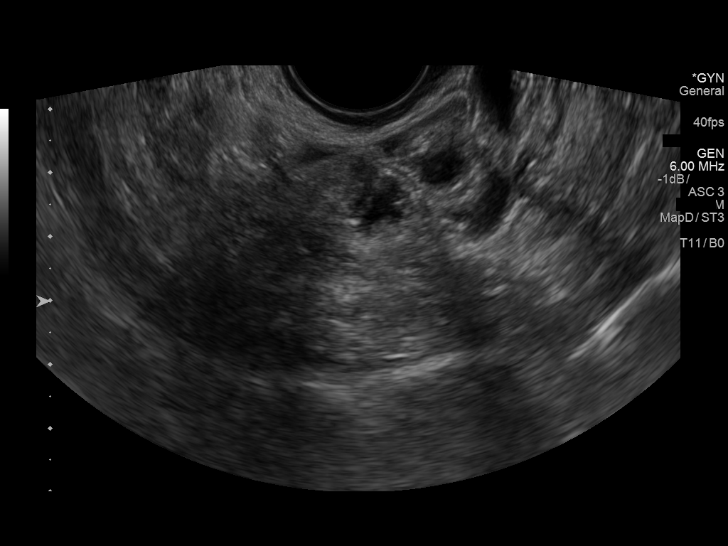

[14 of 25 positions shown; findings below may reference images not displayed]

FINDINGS: Uterus

Measurements: 5.8 x 2.0 x 3.8 cm. No fibroids or other mass
visualized.

Endometrium

Thickness: 2 mm.  No focal abnormality visualized.

Right ovary

Not discretely visualized.  No adnexal mass is seen.

Left ovary

Measurements: 1.8 x 0.9 x 2.0 cm. Normal appearance/no adnexal mass.

Other findings

No abnormal free fluid.
IMPRESSION: Uterus and left ovary are within normal limits.

Right ovary is not discretely visualized.  No adnexal mass is seen.

## 2020-03-06 ENCOUNTER — Other Ambulatory Visit: Payer: Self-pay | Admitting: Family Medicine

## 2020-03-06 ENCOUNTER — Other Ambulatory Visit (HOSPITAL_COMMUNITY)
Admission: RE | Admit: 2020-03-06 | Discharge: 2020-03-06 | Disposition: A | Discharge status: Home / Self Care | Payer: Managed Care, Other (non HMO) | Source: Ambulatory Visit | Attending: Family Medicine | Admitting: Family Medicine

## 2020-03-06 DIAGNOSIS — Z01411 Encounter for gynecological examination (general) (routine) with abnormal findings: Secondary | ICD-10-CM | POA: Insufficient documentation

## 2020-03-09 ENCOUNTER — Other Ambulatory Visit: Payer: Self-pay | Admitting: Family Medicine

## 2020-03-09 DIAGNOSIS — D61818 Other pancytopenia: Secondary | ICD-10-CM

## 2020-03-09 DIAGNOSIS — K760 Fatty (change of) liver, not elsewhere classified: Secondary | ICD-10-CM

## 2020-03-11 LAB — CYTOLOGY - PAP
Comment: NEGATIVE
Diagnosis: NEGATIVE
High risk HPV: NEGATIVE

## 2023-05-07 ENCOUNTER — Other Ambulatory Visit: Payer: Self-pay

## 2023-05-07 ENCOUNTER — Encounter (HOSPITAL_BASED_OUTPATIENT_CLINIC_OR_DEPARTMENT_OTHER): Payer: Self-pay | Admitting: Emergency Medicine

## 2023-05-07 ENCOUNTER — Emergency Department (HOSPITAL_BASED_OUTPATIENT_CLINIC_OR_DEPARTMENT_OTHER)
Admission: EM | Admit: 2023-05-07 | Discharge: 2023-05-07 | Disposition: A | Discharge status: Home / Self Care | Attending: Emergency Medicine | Admitting: Emergency Medicine

## 2023-05-07 DIAGNOSIS — Z7982 Long term (current) use of aspirin: Secondary | ICD-10-CM | POA: Insufficient documentation

## 2023-05-07 DIAGNOSIS — S01511A Laceration without foreign body of lip, initial encounter: Secondary | ICD-10-CM | POA: Insufficient documentation

## 2023-05-07 DIAGNOSIS — I10 Essential (primary) hypertension: Secondary | ICD-10-CM | POA: Insufficient documentation

## 2023-05-07 DIAGNOSIS — W01198A Fall on same level from slipping, tripping and stumbling with subsequent striking against other object, initial encounter: Secondary | ICD-10-CM | POA: Insufficient documentation

## 2023-05-07 HISTORY — DX: Essential (primary) hypertension: I10

## 2023-05-07 MED ORDER — LIDOCAINE HCL (PF) 1 % IJ SOLN
10.0000 mL | Freq: Once | INTRAMUSCULAR | Status: AC
  Administered 2023-05-07: 10 mL
  Filled 2023-05-07: qty 10

## 2023-05-07 MED ORDER — CEFADROXIL 500 MG PO CAPS: 500.0000 mg | ORAL_CAPSULE | Freq: Two times a day (BID) | ORAL | 0 refills | Status: AC

## 2023-05-07 NOTE — ED Triage Notes (Signed)
 Pt sts she fell 1 wk ago and had a lip injury; she said she was told she did not need sutures; started bleeding again today with no re-injury; significant bleeding noted

## 2023-05-07 NOTE — Discharge Instructions (Signed)
 As discussed, your laceration was repaired using absorbable stitches.  Will place you on antibiotics given amount of time since the laceration occurred.  Return if you develop any continued bleeding or signs of infection as we discussed.

## 2023-05-07 NOTE — ED Provider Notes (Signed)
 [facility] Provider Note   CSN: [id number] Arrival date & time: 05/07/23  1427     History  Chief Complaint  Patient presents with   Lip Laceration    Casey Keller is a 53 y.o. female.  HPI   53 year old female presents emergency department with complaints of bleeding of the lip.  States that she had a mechanical fall about a week ago when she tripped on a coffee table hit her face on the floor.  Was seen emergency department at that time with a laceration on the inside of her lower lip.  Reportedly did not need repair.  States that she woke up this morning with bleeding appreciated.  States that lip began to swell accompanied bleeding.  Denies any known trauma/injury but something "may have happened" when she was sleeping.  Denies blood thinner use.  Past medical history significant for hypertension, HPV  Home Medications Prior to Admission medications   Medication Sig Start Date End Date Taking? Authorizing Provider  cefadroxil  (DURICEF) 500 MG capsule Take 1 capsule (500 mg total) by mouth 2 (two) times daily. 05/07/23  Yes Neil Balls A, PA  aspirin EC 81 MG tablet Take 81 mg by mouth daily.    [provider]  atenolol (TENORMIN) 25 MG tablet Take 25 mg by mouth daily. 12/22/16   [provider]      Allergies    Amoxicillin and Valtrex [valacyclovir hcl]    Review of Systems   Review of Systems  All other systems reviewed and are negative.   Physical Exam Updated Vital Signs BP (!) 144/89 (BP Location: Left Arm)   Pulse (!) 50   Temp 98.1 F (36.7 C)   Resp 14   Ht 5\' 6"  (1.676 m)   Wt 63.5 kg   LMP 12/10/2005   SpO2 93%   BMI 22.60 kg/m  Physical Exam Vitals and nursing note reviewed.  Constitutional:      General: She is not in acute distress.    Appearance: She is well-developed.  HENT:     Head: Normocephalic.     Mouth/Throat:     Comments: Large clots appreciated lower lip.   Active "oozing" of blood.  1.2 cm linear laceration appreciated anterior lower lip at midline.  No tenderness of mandible without evidence of malocclusion.  No dental fracture/dislocation. Eyes:     Conjunctiva/sclera: Conjunctivae normal.  Cardiovascular:     Rate and Rhythm: Normal rate and regular rhythm.     Heart sounds: No murmur heard. Pulmonary:     Effort: Pulmonary effort is normal. No respiratory distress.     Breath sounds: Normal breath sounds.  Abdominal:     Palpations: Abdomen is soft.     Tenderness: There is no abdominal tenderness.  Musculoskeletal:        General: No swelling.     Cervical back: Neck supple.  Skin:    General: Skin is warm and dry.     Capillary Refill: Capillary refill takes less than 2 seconds.  Neurological:     Mental Status: She is alert.  Psychiatric:        Mood and Affect: Mood normal.     ED Results / Procedures / Treatments   Labs (all labs ordered are listed, but only abnormal results are displayed) Labs Reviewed - No data to display  EKG None  Radiology No results found.  Procedures .Laceration Repair  Date/Time: 05/07/2023 3:53 PM  Performed by: Mammoth Butter, PA Authorized by: Manzanita Butter, PA   Consent:    Consent obtained:  Verbal   Consent given by:  Patient   Risks, benefits, and alternatives were discussed: yes     Risks discussed:  Need for additional repair, infection, nerve damage, poor wound healing, poor cosmetic result and pain   Alternatives discussed:  Observation, no treatment and delayed treatment Universal protocol:    Procedure explained and questions answered to patient or proxy's satisfaction: yes     Patient identity confirmed:  Verbally with patient Anesthesia:    Anesthesia method:  Nerve block   Block needle gauge:  25 G   Block anesthetic:  Lidocaine  1% w/o epi   Block injection procedure:  Anatomic landmarks identified, anatomic landmarks palpated, negative aspiration for  blood, introduced needle and incremental injection   Block outcome:  Anesthesia achieved Laceration details:    Location:  Lip   Lip location:  Lower interior lip   Length (cm):  1.2 Exploration:    Limited defect created (wound extended): no     Hemostasis achieved with:  Direct pressure   Imaging outcome: foreign body not noted     Wound exploration: wound explored through full range of motion and entire depth of wound visualized     Contaminated: no   Treatment:    Area cleansed with:  Saline   Irrigation solution:  Sterile water   Irrigation volume:  250cc   Irrigation method:  Syringe   Visualized foreign bodies/material removed: no     Debridement:  None   Undermining:  None   Scar revision: no   Skin repair:    Repair method:  Sutures   Suture size:  5-0   Suture material:  Fast-absorbing gut   Suture technique:  Simple interrupted   Number of sutures:  3 Approximation:    Approximation:  Close   Vermilion border well-aligned: no   Repair type:    Repair type:  Simple Post-procedure details:    Dressing:  Open (no dressing)   Procedure completion:  Tolerated well, no immediate complications     Medications Ordered in ED Medications  lidocaine  (PF) (XYLOCAINE ) 1 % injection 10 mL (10 mLs Other Given 05/07/23 1502)    ED Course/ Medical Decision Making/ A&P                                 Medical Decision Making Risk Prescription drug management.   This patient presents to the ED for concern of bleeding, this involves an extensive number of treatment options, and is a complaint that carries with it a high risk of complications and morbidity.  The differential diagnosis includes laceration, coagulopathy, other   Co morbidities that complicate the patient evaluation  See HPI   Additional history obtained:  Additional history obtained from EMR External records from outside source obtained and reviewed including hospital records   Lab  Tests:  N/a   Imaging Studies ordered:  N/a   Cardiac Monitoring: / EKG:  The patient was maintained on a cardiac monitor.  I personally viewed and interpreted the cardiac monitored which showed an underlying rhythm of: sinus rhythm   Consultations Obtained:  N/a   Problem List / ED Course / Critical interventions / Medication management  Lip laceration I ordered medication including lidocaine    Reevaluation of the patient after these medicines showed that the patient improved  I have reviewed the patients home medicines and have made adjustments as needed   Social Determinants of Health:  Denies tobacco, illicit drug use   Test / Admission - Considered:  Lip laceration Vitals signs significant for hypertension blood pressure 144/89. Otherwise within normal range and stable throughout visit. 53 year old female presents emergency department with complaints of bleeding of the lip.  States that she had a mechanical fall about a week ago when she tripped on a coffee table hit her face on the floor.  Was seen emergency department at that time with a laceration on the inside of her lower lip.  Reportedly did not need repair.  States that she woke up this morning with bleeding appreciated.  States that lip began to swell accompanied bleeding.  Denies any known trauma/injury but something "may have happened" when she was sleeping.  Denies blood thinner use. On exam, large clot appreciated in the lower lip around midline with underlying 1.2 cm linear laceration once removed.  Given continued trickling of blood with assumed trauma overnight because bleeding, shared decision making conversation was had with patient regarding closure of wound despite wound being around 46 days old.  Patient elected for this given EKG again without improvement with patient pressure wound.  Laceration repaired in manner as above.  Will place patient on antibiotics given duration of time since her wound was  first noticed.  Will recommend local wound care at home.  Treatment plan discussed with patient and she acknowledged understanding was agreeable to said plan.  Patient will well-appearing, afebrile in no acute distress. Worrisome signs and symptoms were discussed with the patient, and the patient acknowledged understanding to return to the ED if noticed. Patient was stable upon discharge.          Final Clinical Impression(s) / ED Diagnoses Final diagnoses:  Lip laceration, initial encounter    Rx / DC Orders ED Discharge Orders          Ordered    cefadroxil  (DURICEF) 500 MG capsule  2 times daily        05/07/23 1550              Hutchins Butter, [place] 05/07/23 1556    Albertus Hughs, DO 05/07/23 1944

## 2024-02-22 ENCOUNTER — Other Ambulatory Visit (HOSPITAL_BASED_OUTPATIENT_CLINIC_OR_DEPARTMENT_OTHER): Payer: Self-pay | Admitting: Family Medicine

## 2024-02-22 DIAGNOSIS — E7800 Pure hypercholesterolemia, unspecified: Secondary | ICD-10-CM
